# Patient Record
Sex: Male | Born: 1945 | Race: Black or African American | Hispanic: No | Smoking: Current every day smoker
Health system: Southern US, Community
[De-identification: ages and names within clinical notes are randomized; demographics above are authoritative.]

## PROBLEM LIST (undated history)

## (undated) DIAGNOSIS — E119 Type 2 diabetes mellitus without complications: Secondary | ICD-10-CM

---

## 2008-12-07 ENCOUNTER — Inpatient Hospital Stay (HOSPITAL_COMMUNITY): Admission: AD | Admit: 2008-12-07 | Discharge: 2008-12-09 | Payer: Self-pay | Admitting: Internal Medicine

## 2008-12-07 ENCOUNTER — Encounter: Payer: Self-pay | Admitting: Emergency Medicine

## 2008-12-07 ENCOUNTER — Ambulatory Visit: Payer: Self-pay | Admitting: Diagnostic Radiology

## 2008-12-08 ENCOUNTER — Encounter (INDEPENDENT_AMBULATORY_CARE_PROVIDER_SITE_OTHER): Payer: Self-pay | Admitting: Internal Medicine

## 2009-01-12 ENCOUNTER — Ambulatory Visit: Payer: Self-pay | Admitting: Internal Medicine

## 2009-03-03 ENCOUNTER — Encounter: Admission: RE | Admit: 2009-03-03 | Discharge: 2009-03-03 | Payer: Self-pay | Admitting: Internal Medicine

## 2010-07-07 IMAGING — CR DG CHEST 2V
2 series · 2 of 2 positions shown · non-contrast
Comparison: None.

CLINICAL DATA: Right-sided weakness and right arm parasthesias.

CHEST - 2 VIEW

[w chest pa]
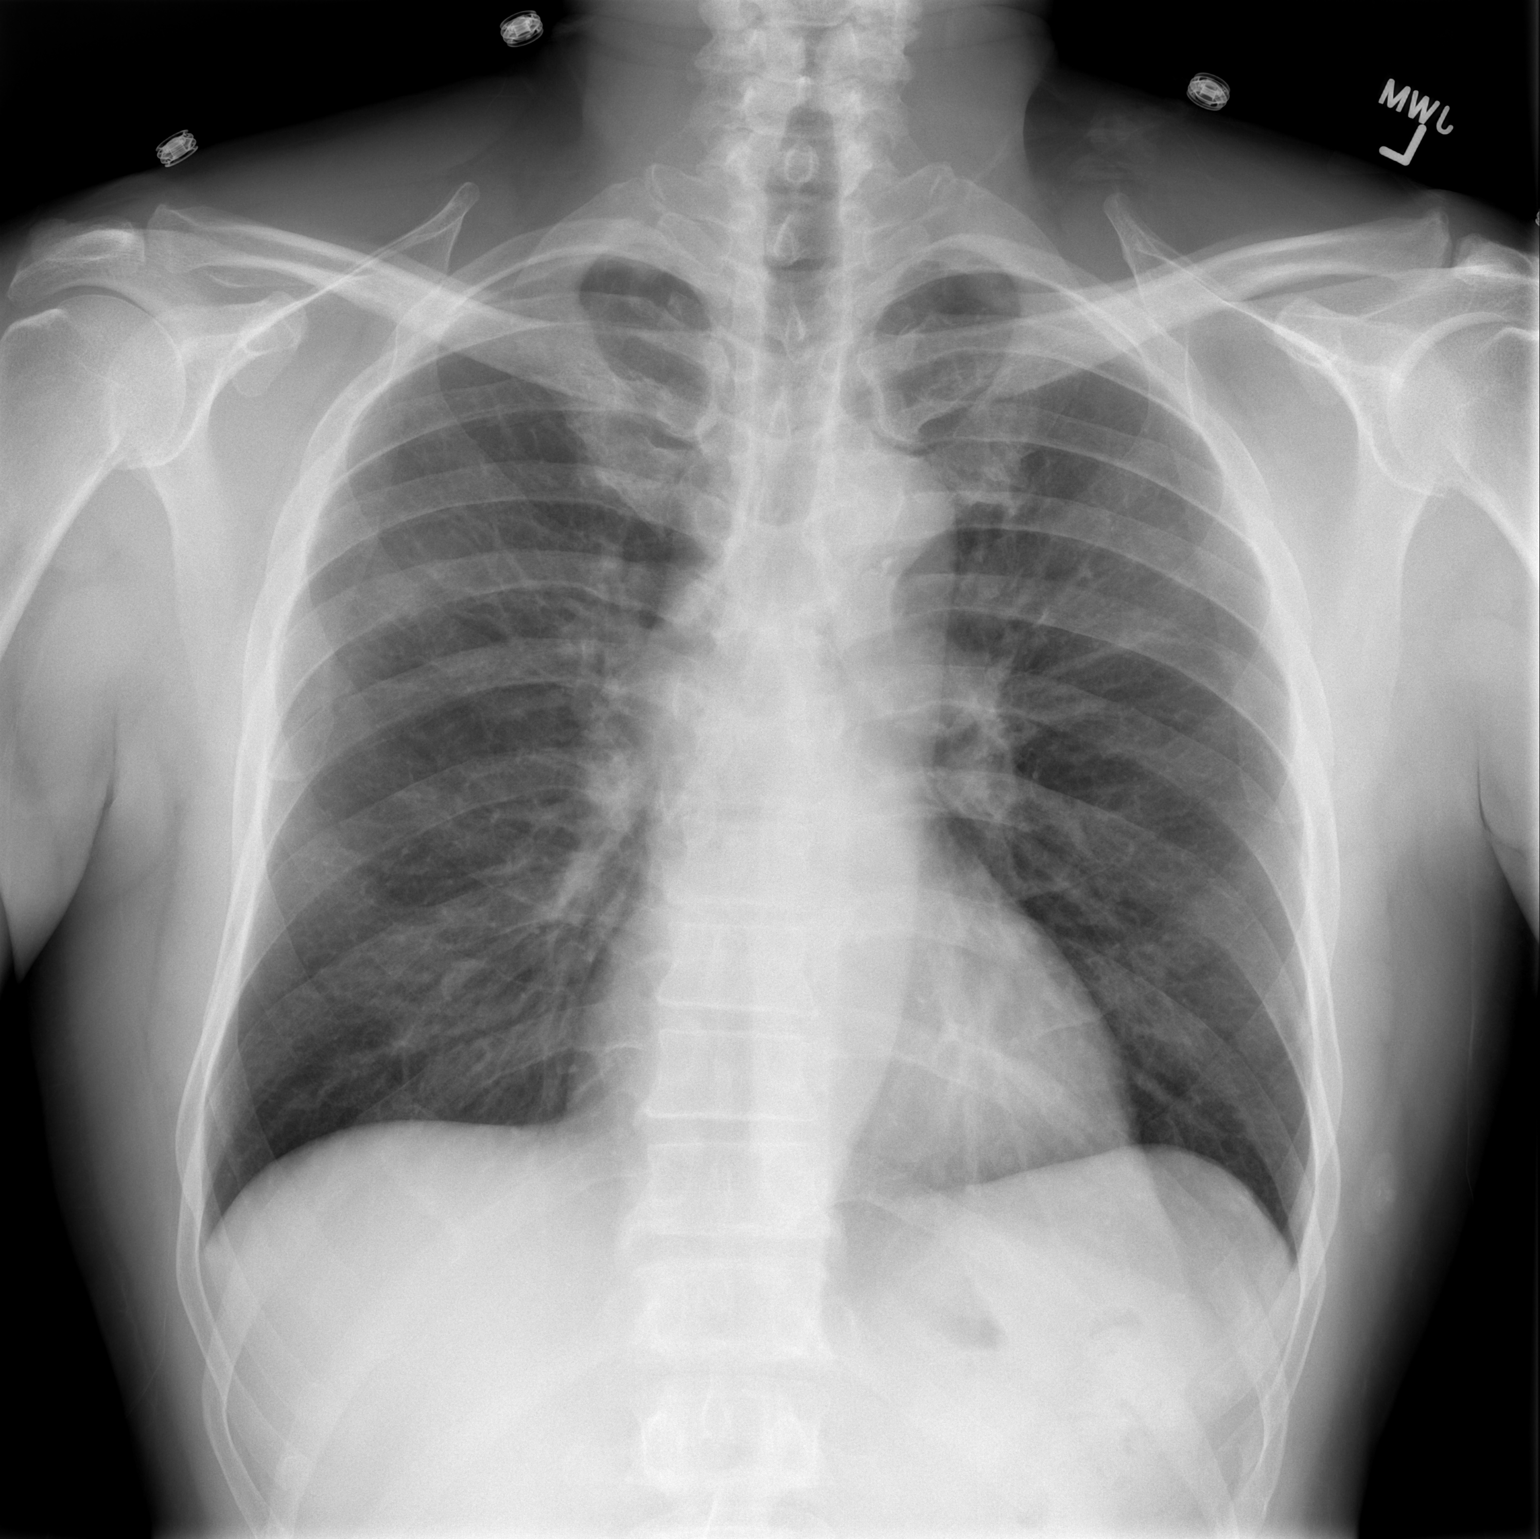

[w chest lat]
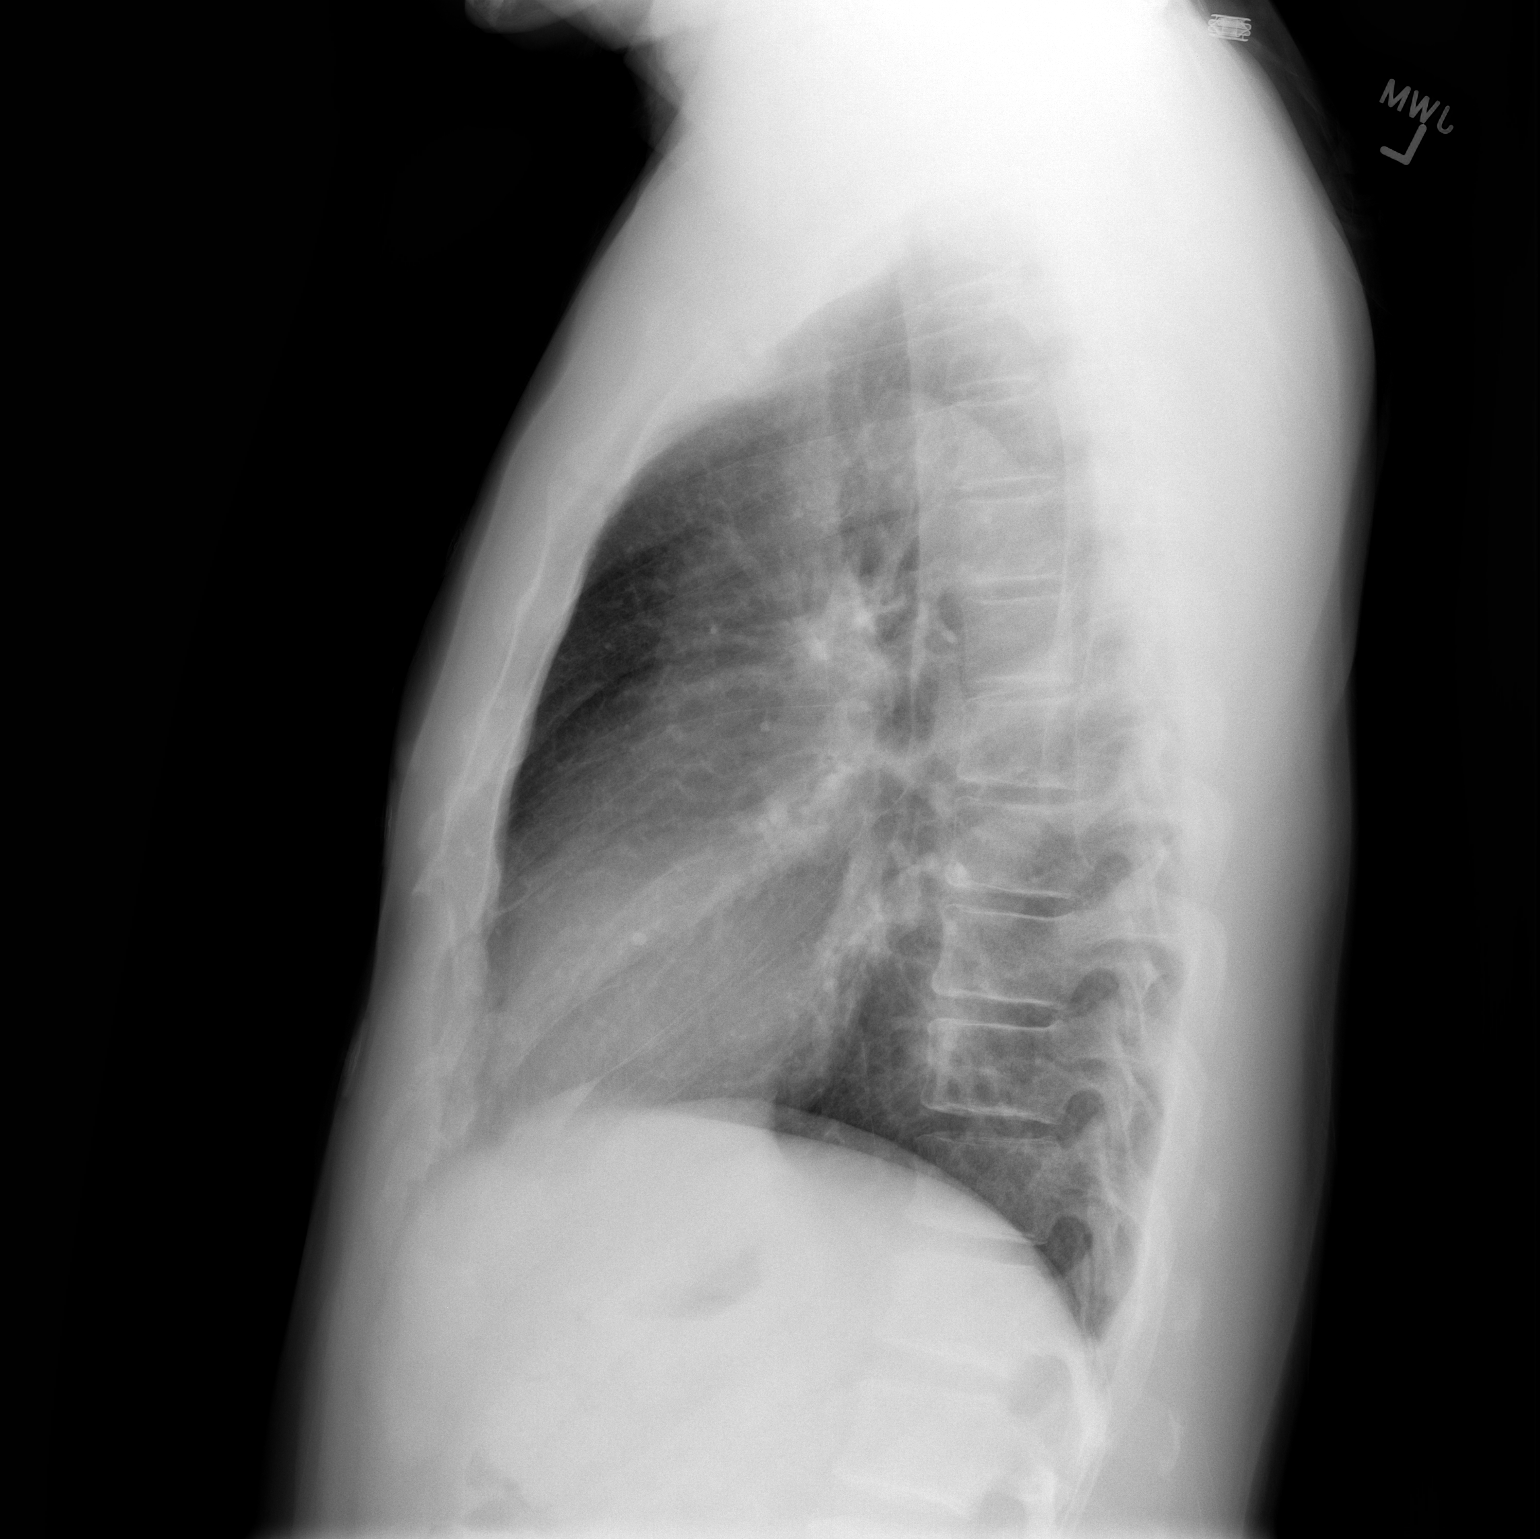

[2 of 2 positions shown; findings below may reference images not displayed]

FINDINGS: Normal sized heart.  Mildly tortuous aorta.  Clear lungs.
Minimal diffuse peribronchial thickening.  Unremarkable bones.
IMPRESSION: Minimal chronic bronchitic changes.  No acute abnormality.

## 2011-05-02 NOTE — Consult Note (Signed)
NAME:  Tim Watts, Tim Watts               ACCOUNT NO.:  [id number]   MEDICAL RECORD NO.:  [id number]          PATIENT TYPE:  INP   LOCATION:  3007                         FACILITY:  [facility]   PHYSICIAN:  Pramod P. Pearlean Brownie, MD    DATE OF BIRTH:  06-20-1946   DATE OF CONSULTATION:  DATE OF DISCHARGE:                                 CONSULTATION   REASON FOR REFERRAL:  Stroke.   HISTORY OF PRESENT ILLNESS:  Tim Watts is a 65 year old African  American gentleman who developed sudden onset of dizziness, right-sided  clumsiness 2 days ago while working in the yard.  He did not seek  medical help, but came in for admission the next day.  He has a long-  standing history of diabetes, but does not take any medications for  that.  On arrival yesterday, he was noticed as having mild right-sided  clumsiness which seems to be improving.  He is walking better with  physical therapy today.   PAST MEDICAL HISTORY:  Significant for diabetes and smoking.   HOME MEDICATIONS:  None.   MEDICATION ALLERGIES:  None.   SOCIAL HISTORY:  The patient smokes 1 pack per day.  Denies drinking  alcohol, though he has been known to use cocaine and marijuana on a  weekly basis.  He is living at home.  He is married.  He is independent  in activities of living.   REVIEW OF SYSTEMS:  Negative for any significant fever, cough, chest  pain, shortness of breath, diarrhea, or other illness.   PHYSICAL EXAMINATION:  GENERAL:  Middle-aged [place] American gentleman  who is at present not in distress.  VITAL SIGNS:  He is afebrile, pulse rate 62 per minute regular,  respiratory rate 20 per minute, temperature 97.6, blood pressure 139-169  systolic over 82-84 diastolic.  HEENT:  Head is nontraumatic.  ENT exam unremarkable.  NECK:  Supple.  There is no bruit.  CARDIAC:  Regular heart sounds.  LUNGS:  Clear to auscultation.  NEUROLOGIC:  He is pleasant, awake, alert, and cooperative with no  aphasia, apraxia, or dysarthria.   Eye movements are full range.  There  is no nystagmus or facial asymmetry.  Tongue is midline.  Motor system  exam reveals no upper extremity drift, but fine finger movements are  diminished on the right.  He orbits the left over the right upper  extremity.  There is minimum weakness of right grip.  Foot tapping is  slightly slow on the right.  He walks with a broad-based gait.   DATA REVIEW:  An MRI scan of the brain shows a left paramedian pontine  infarct.  There are mild changes of small vessel disease.  MRA of the  brain shows no significant large vessel intracranial stenosis.   LABORATORY STUDIES:  Hemoglobin A1c is elevated at 7.4, total  cholesterol is 167, but LDL is elevated at 106, HDL is 39, triglycerides  161.  Urine drug screen is positive only for benzos.  Carotid Doppler  shows no significant extracranial stenosis.  2-D echo results are  pending at this time.  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   PLAN:  I agree with starting aspirin for secondary stroke prevention as  well as smoking cessation counseling and starting Zocor for his elevated  lipid.  Tight control of diabetes with hemoglobin A1c goal below 7.   I had a long discussion with the patient and multiple family members  regarding the need to be compliant with medications and to control his  risk factors to prevent him from having more disabling strokes in the  future.  He can follow up with me electively in the future in the  office.  The patient can be discharged home in the a.m. with outpatient  therapy needs as required.   Thank you for the referral.           ______________________________  Sunny Schlein. Pearlean Brownie, MD     PPS/MEDQ  D:  12/08/2008  T:  12/09/2008  Job:  [id number]

## 2011-05-02 NOTE — Discharge Summary (Signed)
NAME:  Tim Watts, Tim Watts NO.:  [id number]   MEDICAL RECORD NO.:  [id number]          PATIENT TYPE:  INP   LOCATION:  3007                         FACILITY:  [facility]   PHYSICIAN:  Beckey Rutter, MD  DATE OF BIRTH:  [birth year]   DATE OF ADMISSION:  12/07/2008  DATE OF DISCHARGE:  12/09/2008                               DISCHARGE SUMMARY   PRIMARY CARE PHYSICIAN:  Unassigned.   CHIEF COMPLAINT:  Right-sided clumsiness and dizziness.   HISTORY OF PRESENT ILLNESS:  Please refer to the H&P dictated on the day  of admission by Dr. Jetty Duhamel.   HOSPITAL COURSE:  During hospital course, the patient was seen by stroke  team.  The patient had MRI and MRA which revealed left pontine posterior  infarct.  The patient was worked up for stroke and seen by stroke team.  Please refer to the result of the MRI/MRA below.   HOSPITAL PROCEDURES:  1. Chest x-ray on December 07, 2008, impression; minimal chronic      bronchitic change and no acute abnormalities.  2. December 07, 2008, the patient had CT head without contrast.      Impression was normal examination.  3. On December 07, 2008, the patient had MRA an MRI.  The MRI result      is showing acute nonhemorrhagic left pontine infarct.  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  Mild      branch vessel atherosclerotic type change more notable within the      posterior circulation.   LABORATORY DATA:  Homocysteine is 9.1.  Lipid profile showing total  cholesterol 167, TG is 112, HDL is 39, LDL is 106, A1c is 7.4.   DISCHARGE MEDICATIONS:  1. Metformin 500 mg p.o. b.i.d.  2. Zocor 20 mg p.o. at night.  3. Aspirin 325 mg daily.   DISCHARGE PLAN:  The patient will await for the result of 2-D echo for  completion of work.  He will be discharged today in the medication  above.  The patient had [facility]  appointment for follow-up and  he was also advised to follow up with Neurology.  The patient's diabetes  was stable during the hospital stay and he will  be discharged with metformin.  The patient was advised and counseled  regarding tobacco abuse and substance abuse.  Prescription for pain and  glucometer was done.  All the patient's questions was encouraged and  answered.  He is stable for discharge.  He is aware and agreeable to  discharge plan.      Beckey Rutter, MD  Electronically Signed     EME/MEDQ  D:  12/09/2008  T:  12/09/2008  Job:  [id number]

## 2011-05-02 NOTE — H&P (Signed)
NAME:  Tim Watts, Tim Watts NO.:  [id number]   MEDICAL RECORD NO.:  [id number]          PATIENT TYPE:  INP   LOCATION:  3007                         FACILITY:  [facility]   PHYSICIAN:  Lonia Blood, M.D.DATE OF BIRTH:  1946-02-11   DATE OF ADMISSION:  12/07/2008  DATE OF DISCHARGE:                              HISTORY & PHYSICAL   PRIMARY CARE PHYSICIAN:  Unassigned.   CHIEF COMPLAINT:  Right-sided clumsiness and dizziness.   HISTORY OF PRESENT ILLNESS:  Tim Watts is a 65 year old  gentleman who does not receive regular medical care due to financial  issues.  He has a known history of borderline diabetes.  He admits to  marijuana use and occasional cocaine abuse as well as essentially  lifelong tobacco abuse.  He was in his usual state of health, working in  the yard yesterday when at approximately 3:00 p.m. he experienced the  acute onset of dizziness.  This was mild initially.  Over approximately  a 15- to 20-minute period it became much more severe to the point he  felt he would fall down.  He went into the house to rest.  After  approximately 45 minutes the patient stood again and found that he was  falling severely to the right.  He felt that his right arm and leg were  very clumsy.  He went to bed.  When he woke up this morning he noticed  that he was significantly weaker in the right leg and arm.  He also  states that these extremities were very clumsy.  He had difficulty  brushing his teeth or combing his hair, as he is right handed.  He also  felt that he was constantly falling to the right.  [facility] for evaluation and was ultimately transferred  for direct admission at [facility] for evaluation of stroke.   The patient states that he has not had similar problems in the past.  He  has no chest pain.  No fevers, chills, nausea, vomiting, diarrhea,  abdominal pain, shortness of breath, cough, fevers.  He  has had no acute  visual change of late.   REVIEW OF SYSTEMS:  Comprehensive review of systems is unremarkable with  the exception of the positive elements noted in the history of present  illness above.   PAST MEDICAL HISTORY:  1. Borderline diabetes.  2. Tobacco abuse in the amount of one pack per day minimum since      teenager.  3. Occasional cocaine abuse.  4. Occasional marijuana abuse.   OUTPATIENT MEDICATIONS:  None.   ALLERGIES:  No known drug allergies.   FAMILY HISTORY:  The patient's mother died from breast cancer.  The  patient's father died from a heat stroke.   SOCIAL HISTORY:  The patient lives in [place], he is presently  unemployed.  He is married.  He has healthy children.  He occasionally  partakes of alcohol but not to excess.   DATA REVIEW:  CBC is unremarkable.  Alcohol level is negative.  BMET is  unremarkable.  Coags are normal.  Urinalysis is negative.  Urine drug  screen is positive for cocaine. Point of care markers are negative x1.  CT scan of the head reveals no acute disease.  Chest x-ray reveals no  acute disease.   A 12-lead EKG reveals normal sinus rhythm at 66 beats per minute.  There  is significant J-point elevation in leads V2 and V3 and some peaking T-  waves in V4 and V5.  There is no clear ST-segment elevation.  There do  appear to be occasional missed beats.   PHYSICAL EXAMINATION:  VITAL SIGNS:  Temperature 97.8, blood pressure  169/83, heart rate 64, respiratory rate 20, O2 sat 95% on room air.  CBG  is 161.  GENERAL:  Well-developed, well-nourished gentleman in no acute  respiratory distress.  HEENT:  Normocephalic, atraumatic.  Pupils are equal, round, reactive to  light and accommodation.  Extraocular muscles are intact bilaterally.  OC/OO clear.  NECK:  No JVD.  LUNGS: Clear to auscultation bilaterally without wheezes or rhonchi.  CARDIOVASCULAR: Regular rate and rhythm without murmur, gallop or rub.  Normal S1, S2.   ABDOMEN: Nontender, nondistended, soft bowel sounds present.  No  organomegaly, no rebound or ascites.  EXTREMITIES: No significant  cyanosis, clubbing, edema to bilateral upper and lower extremities.  NEUROLOGIC: The patient displays a 5/5 strength of bilateral upper and  lower extremities. His finger-to-nose is intact at this point, though  very slow. There is no nystagmus.  Cranial nerves II-XII are intact  bilaterally.  There is no Babinski.  DTRs are 1+ and symmetric.   IMPRESSION AND PLAN:  1. 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  He in fact could touch      the examiner's finger, but every single time missed his own nose      and frequently touched his ear or even his jaw.  This has      significantly improved at the present time.  The patient certainly      has significant risk factors for a CVA and not to mention cocaine      abuse on top of this.  It does appear, however, that he is rapidly      improving though it has now been significantly greater than 24      hours since the onset of his symptoms.  We will initiate full      evaluation as per the stroke protocol.  MRI and MRA will be      accomplished as well as the other multitude of tests indicated on      the a stroke protocol.  Aspirin will be initiated, as the patient      was previously on no medication whatsoever.  I have already      counseled the patient extensively as to his need to stop smoking      and to discontinue cocaine abuse immediately.  Physical therapy,      occupational therapy, and speech and language pathology will be      consulted, and ongoing care will be carried out as indicated.  2. Diabetes mellitus - it would appear despite the patient's labeling      of borderline diabetes the  patient  has true, full-blown diabetes.      We will place the patient on sliding scale insulin and a restricted      diet if he is cleared for p.o. intake and follow his CBGs closely.      It is quite likely that he will need it at minimum oral agents at      the time of his discharge.  3. Tobacco abuse - I have counseled the patient extensively as to the      direct connection between ongoing tobacco abuse and cerebrovascular      disease.  I have advised him to discontinue smoking immediately.      We will request tobacco cessation consultation during the patient's      hospital stay to further drive this point home.  4. Substance abuse - I have explained to the patient the use of      cocaine puts even 20 year olds at exceedingly high risk for      cerebrovascular events.  I have explained to him that it      significantly increased his risk of not only recurrent strokes but      also of heart attacks.  I have advised him to discontinue cocaine      abuse immediately and completely.  5. Elevated blood pressure - it is not clear at this time if the      patient truly has hypertension or if he simply has an      elevated blood pressure in that his systolic is approximately 160-      170 at present, I do not wish to acutely decrease it.  We will      follow his blood pressure trend closely and consider treatment if      his systolic becomes further elevated.      Lonia Blood, M.D.  Electronically Signed     JTM/MEDQ  D:  12/07/2008  T:  12/07/2008  Job:  [id number]

## 2011-09-22 LAB — LIPID PANEL
LDL Cholesterol: 106 mg/dL — ABNORMAL HIGH (ref 0–99)
Total CHOL/HDL Ratio: 4.3 RATIO
Triglycerides: 112 mg/dL (ref <150)
VLDL: 22 mg/dL (ref 0–40)

## 2011-09-22 LAB — GLUCOSE, CAPILLARY
Glucose-Capillary: 120 mg/dL — ABNORMAL HIGH (ref 70–99)
Glucose-Capillary: 132 mg/dL — ABNORMAL HIGH (ref 70–99)
Glucose-Capillary: 147 mg/dL — ABNORMAL HIGH (ref 70–99)
Glucose-Capillary: 161 mg/dL — ABNORMAL HIGH (ref 70–99)

## 2011-09-22 LAB — POCT CARDIAC MARKERS
CKMB, poc: 1.5 ng/mL (ref 1.0–8.0)
Myoglobin, poc: 60.7 ng/mL (ref 12–200)
Troponin i, poc: <0.05 ng/mL (ref 0.00–0.09)

## 2011-09-22 LAB — URINALYSIS, ROUTINE W REFLEX MICROSCOPIC
Bilirubin Urine: NEGATIVE
Hgb urine dipstick: NEGATIVE
Ketones, ur: NEGATIVE mg/dL
Nitrite: NEGATIVE
Protein, ur: NEGATIVE mg/dL
Specific Gravity, Urine: 1.012 (ref 1.005–1.030)
Urobilinogen, UA: 0.2 mg/dL (ref 0.0–1.0)

## 2011-09-22 LAB — CBC
MCHC: 31.7 g/dL (ref 30.0–36.0)
Platelets: 214 10*3/uL (ref 150–400)
RBC: 4.96 MIL/uL (ref 4.22–5.81)
WBC: 6.4 10*3/uL (ref 4.0–10.5)

## 2011-09-22 LAB — COMPREHENSIVE METABOLIC PANEL
AST: 20 U/L (ref 0–37)
CO2: 26 mEq/L (ref 19–32)
Calcium: 9.3 mg/dL (ref 8.4–10.5)
Creatinine, Ser: 0.99 mg/dL (ref 0.4–1.5)
GFR calc Af Amer: >60 mL/min (ref >60)
GFR calc non Af Amer: >60 mL/min (ref >60)
Glucose, Bld: 139 mg/dL — ABNORMAL HIGH (ref 70–99)
Sodium: 137 mEq/L (ref 135–145)
Total Protein: 7 g/dL (ref 6.0–8.3)

## 2011-09-22 LAB — CARDIAC PANEL(CRET KIN+CKTOT+MB+TROPI): Troponin I: <0.01 ng/mL (ref 0.00–0.06)

## 2011-09-22 LAB — DIFFERENTIAL
Basophils Relative: 1 % (ref 0–1)
Eosinophils Absolute: 0.2 10*3/uL (ref 0.0–0.7)
Monocytes Relative: 9 % (ref 3–12)
Neutro Abs: 3.1 10*3/uL (ref 1.7–7.7)
Neutrophils Relative %: 50 % (ref 43–77)

## 2011-09-22 LAB — BLOOD GAS, ARTERIAL
Acid-Base Excess: 1 mmol/L (ref 0.0–2.0)
FIO2: 0.21 %
pCO2 arterial: 41.1 mmHg (ref 35.0–45.0)
pO2, Arterial: 81 mmHg (ref 80.0–100.0)

## 2011-09-22 LAB — POCT TOXICOLOGY PANEL: Cocaine: POSITIVE

## 2011-09-22 LAB — URINE CULTURE

## 2011-09-22 LAB — BASIC METABOLIC PANEL
BUN: 15 mg/dL (ref 6–23)
CO2: 25 mEq/L (ref 19–32)
Calcium: 9.1 mg/dL (ref 8.4–10.5)
Creatinine, Ser: 0.9 mg/dL (ref 0.4–1.5)
GFR calc Af Amer: >60 mL/min (ref >60)

## 2011-09-22 LAB — PROTIME-INR
INR: 1 (ref 0.00–1.49)
Prothrombin Time: 13.4 seconds (ref 11.6–15.2)

## 2011-09-22 LAB — ETHANOL: Alcohol, Ethyl (B): <10 mg/dL (ref 0–10)

## 2011-09-22 LAB — HEMOGLOBIN A1C: Hgb A1c MFr Bld: 7.4 % — ABNORMAL HIGH (ref 4.6–6.1)

## 2011-09-22 LAB — APTT: aPTT: 26 seconds (ref 24–37)

## 2023-03-18 ENCOUNTER — Encounter (HOSPITAL_BASED_OUTPATIENT_CLINIC_OR_DEPARTMENT_OTHER): Payer: Self-pay

## 2023-03-18 ENCOUNTER — Emergency Department (HOSPITAL_BASED_OUTPATIENT_CLINIC_OR_DEPARTMENT_OTHER)
Admission: EM | Admit: 2023-03-18 | Discharge: 2023-03-18 | Disposition: A | Discharge status: Home / Self Care | Payer: Medicare (Managed Care) | Attending: Emergency Medicine | Admitting: Emergency Medicine

## 2023-03-18 ENCOUNTER — Emergency Department (HOSPITAL_BASED_OUTPATIENT_CLINIC_OR_DEPARTMENT_OTHER): Payer: Medicare (Managed Care)

## 2023-03-18 DIAGNOSIS — E119 Type 2 diabetes mellitus without complications: Secondary | ICD-10-CM | POA: Insufficient documentation

## 2023-03-18 DIAGNOSIS — M5441 Lumbago with sciatica, right side: Secondary | ICD-10-CM

## 2023-03-18 DIAGNOSIS — I10 Essential (primary) hypertension: Secondary | ICD-10-CM | POA: Insufficient documentation

## 2023-03-18 DIAGNOSIS — M545 Low back pain, unspecified: Secondary | ICD-10-CM | POA: Diagnosis present

## 2023-03-18 HISTORY — DX: Type 2 diabetes mellitus without complications: E11.9

## 2023-03-18 MED ORDER — LIDOCAINE 5 % EX PTCH: 1.0000 | MEDICATED_PATCH | CUTANEOUS | 0 refills | Status: AC

## 2023-03-18 MED ORDER — KETOROLAC TROMETHAMINE 15 MG/ML IJ SOLN
15.0000 mg | Freq: Once | INTRAMUSCULAR | Status: AC
  Administered 2023-03-18: 15 mg via INTRAMUSCULAR
  Filled 2023-03-18: qty 1

## 2023-03-18 MED ORDER — ACETAMINOPHEN 325 MG PO TABS: 650.0000 mg | ORAL_TABLET | Freq: Four times a day (QID) | ORAL | 0 refills | Status: AC | PRN

## 2023-03-18 MED ORDER — LIDOCAINE 5 % EX PTCH
1.0000 | MEDICATED_PATCH | CUTANEOUS | Status: DC
  Administered 2023-03-18: 1 via TRANSDERMAL
  Filled 2023-03-18: qty 1

## 2023-03-18 MED ORDER — METHOCARBAMOL 500 MG PO TABS: 500.0000 mg | ORAL_TABLET | Freq: Three times a day (TID) | ORAL | 0 refills | Status: AC | PRN

## 2023-03-18 NOTE — ED Notes (Signed)
D/c paperwork reviewed with pt, including prescriptions.  No questions or concerns voiced at time of d/c. Marland Kitchen Pt verbalized understanding, Wheeled by ED staff to ED exit, NAD.

## 2023-03-18 NOTE — ED Triage Notes (Signed)
Pt reports back pain that radiates down right leg. Woke up Saturday morning with pain .

## 2023-03-18 NOTE — ED Provider Notes (Signed)
[facility] Provider Note   CSN: [id number] Arrival date & time: 03/18/23  1559     History {Add pertinent medical, surgical, social history, OB history to HPI:1} Chief Complaint  Patient presents with   Back Pain    Tim Watts is a 77 y.o. male.  History of type 2 diabetes and hypertension.  He is on metformin.  Presents the ER for right low back pain that started yesterday radiates down the posterior aspect of the right leg.  Pain is worse with twisting his torso and bending over.  Denies numbness or tingling, no saddle anesthesia or paresthesia, no bowel or bladder incontinence, no fevers chills nausea or other symptoms.  He has never had this in the past.  Denies any injury or trauma or overexertion prior to symptom onset.   Back Pain      Home Medications Prior to Admission medications   Not on File      Allergies    Patient has no known allergies.    Review of Systems   Review of Systems  Musculoskeletal:  Positive for back pain.    Physical Exam Updated Vital Signs BP (!) 166/82 (BP Location: Right Arm)   Pulse (!) 110   Temp 98.6 F (37 C) (Oral)   Resp 17   Ht 6' (1.829 m)   Wt 68.5 kg   SpO2 98%   BMI 20.48 kg/m  Physical Exam Vitals and nursing note reviewed.  Constitutional:      General: He is not in acute distress.    Appearance: He is well-developed.  HENT:     Head: Normocephalic and atraumatic.  Eyes:     Conjunctiva/sclera: Conjunctivae normal.  Cardiovascular:     Rate and Rhythm: Normal rate and regular rhythm.     Heart sounds: No murmur heard.    Comments: HR 88 on monitor Pulmonary:     Effort: Pulmonary effort is normal. No respiratory distress.     Breath sounds: Normal breath sounds.  Abdominal:     Palpations: Abdomen is soft.     Tenderness: There is no abdominal tenderness.  Musculoskeletal:        General: No swelling. Normal range of motion.     Cervical back: Neck  supple.     Thoracic back: Normal.     Lumbar back: Tenderness present. No swelling or edema. Negative right straight leg raise test and negative left straight leg raise test.  Skin:    General: Skin is warm and dry.     Capillary Refill: Capillary refill takes less than 2 seconds.  Neurological:     General: No focal deficit present.     Mental Status: He is alert and oriented to person, place, and time.  Psychiatric:        Mood and Affect: Mood normal.     ED Results / Procedures / Treatments   Labs (all labs ordered are listed, but only abnormal results are displayed) Labs Reviewed - No data to display  EKG None  Radiology No results found.  Procedures Procedures  {Document cardiac monitor, telemetry assessment procedure when appropriate:1}  Medications Ordered in ED Medications  ketorolac (TORADOL) 15 MG/ML injection 15 mg (has no administration in time range)  lidocaine (LIDODERM) 5 % 1 patch (has no administration in time range)    ED Course/ Medical Decision Making/ A&P   {   Click here for ABCD2, HEART and other calculatorsREFRESH Note before signing :  1}                          Medical Decision Making This patient presents to the ED for concern of back pain this involves an extensive number of treatment options, and is a complaint that carries with it a high risk of complications and morbidity.  The differential diagnosis includes sprain, strain, HNP, fracture, DDD, muscle spasm, cauda equina, epidural abscess or hematoma, malignancy, other   Co morbidities that complicate the patient evaluation  ***   Additional history obtained:  Additional history obtained from *** External records from outside source obtained and reviewed including ***   Lab Tests:  I Ordered, and personally interpreted labs.  The pertinent results include:  ***    Imaging Studies ordered:  I ordered imaging studies including ***  I independently visualized and interpreted  imaging which showed *** I agree with the radiologist interpretation      Problem List / ED Course / Critical interventions / Medication management  Patient presents with low back pain ***. Differential considered as above. Symptoms are likely due to ***. They have no high risk features including saddle anesthesia/paresthesia, bowel or bladder incontinence, urinary retention, fever, weight loss, history of cancer or immune suppression, or IV drug use. We discussed plan to treat symptoms with ***, and follow up with ***. They were advised on strict return precautions.  I ordered medication including ***  for ***  Reevaluation of the patient after these medicines showed that the patient {resolved/improved/worsened:23923::"improved"} I have reviewed the patients home medicines and have made adjustments as needed     Test / Admission - Considered:  Consider labs the patient has no systemic symptoms.  Pain is reproducible with range of motion and palpation.  CT ordered and shows bulge at L1-L2 and some mild spinal stenosis, no acute findings.   Amount and/or Complexity of Data Reviewed Radiology: ordered.  Risk Prescription drug management.   ***  {Document critical care time when appropriate:1} {Document review of labs and clinical decision tools ie heart score, Chads2Vasc2 etc:1}  {Document your independent review of radiology images, and any outside records:1} {Document your discussion with family members, caretakers, and with consultants:1} {Document social determinants of health affecting pt's care:1} {Document your decision making why or why not admission, treatments were needed:1} Final Clinical Impression(s) / ED Diagnoses Final diagnoses:  None    Rx / DC Orders ED Discharge Orders     None

## 2023-03-18 NOTE — ED Notes (Signed)
Pt transported to imaging.

## 2023-03-19 NOTE — ED Provider Notes (Incomplete)
[facility] Provider Note   CSN: [id number] Arrival date & time: 03/18/23  1559     History {Add pertinent medical, surgical, social history, OB history to HPI:1} Chief Complaint  Patient presents with  . Back Pain    Tim Watts is a 77 y.o. male.  History of type 2 diabetes and hypertension.  He is on metformin.  Presents the ER for right low back pain that started yesterday radiates down the posterior aspect of the right leg.  Pain is worse with twisting his torso and bending over.  Denies numbness or tingling, no saddle anesthesia or paresthesia, no bowel or bladder incontinence, no fevers chills nausea or other symptoms.  He has never had this in the past.  Denies any injury or trauma or overexertion prior to symptom onset.   Back Pain      Home Medications Prior to Admission medications   Not on File      Allergies    Patient has no known allergies.    Review of Systems   Review of Systems  Musculoskeletal:  Positive for back pain.    Physical Exam Updated Vital Signs BP (!) 166/82 (BP Location: Right Arm)   Pulse (!) 110   Temp 98.6 F (37 C) (Oral)   Resp 17   Ht 6' (1.829 m)   Wt 68.5 kg   SpO2 98%   BMI 20.48 kg/m  Physical Exam Vitals and nursing note reviewed.  Constitutional:      General: He is not in acute distress.    Appearance: He is well-developed.  HENT:     Head: Normocephalic and atraumatic.  Eyes:     Conjunctiva/sclera: Conjunctivae normal.  Cardiovascular:     Rate and Rhythm: Normal rate and regular rhythm.     Heart sounds: No murmur heard.    Comments: HR 88 on monitor Pulmonary:     Effort: Pulmonary effort is normal. No respiratory distress.     Breath sounds: Normal breath sounds.  Abdominal:     Palpations: Abdomen is soft.     Tenderness: There is no abdominal tenderness.  Musculoskeletal:        General: No swelling. Normal range of motion.     Cervical back: Neck  supple.     Thoracic back: Normal.     Lumbar back: Tenderness present. No swelling or edema. Negative right straight leg raise test and negative left straight leg raise test.  Skin:    General: Skin is warm and dry.     Capillary Refill: Capillary refill takes less than 2 seconds.  Neurological:     General: No focal deficit present.     Mental Status: He is alert and oriented to person, place, and time.  Psychiatric:        Mood and Affect: Mood normal.     ED Results / Procedures / Treatments   Labs (all labs ordered are listed, but only abnormal results are displayed) Labs Reviewed - No data to display  EKG None  Radiology No results found.  Procedures Procedures  {Document cardiac monitor, telemetry assessment procedure when appropriate:1}  Medications Ordered in ED Medications  ketorolac (TORADOL) 15 MG/ML injection 15 mg (has no administration in time range)  lidocaine (LIDODERM) 5 % 1 patch (has no administration in time range)    ED Course/ Medical Decision Making/ A&P   {   Click here for ABCD2, HEART and other calculatorsREFRESH Note before signing :  1}                          Medical Decision Making This patient presents to the ED for concern of back pain this involves an extensive number of treatment options, and is a complaint that carries with it a high risk of complications and morbidity.  The differential diagnosis includes sprain, strain, HNP, fracture, DDD, muscle spasm, cauda equina, epidural abscess or hematoma, malignancy, other   Co morbidities that complicate the patient evaluation  Type 2 diabetes, hypertension   Additional history obtained:  Additional history obtained from EMR External records from outside source obtained and reviewed including  outpatient PCP notes   Lab Tests:   Imaging Studies ordered: CT lumbar spine ordered due to patient's advanced age and history of no previous back pain.  He has gotten mild disc bulge L1-L2  and to the level degenerative changes.     Problem List / ED Course / Critical interventions / Medication management  Patient presents with low back pain that is atraumatic. Differential considered as above. 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 They have no high risk features including saddle anesthesia/paresthesia, bowel or bladder incontinence, urinary retention, fever, weight loss, history of cancer or immune suppression, or IV drug use. We discussed plan to treat symptoms with muscle relaxers,, and follow up with ***. They were advised on strict return precautions.  I ordered medication including ***  for ***  Reevaluation of the patient after these medicines showed that the patient {resolved/improved/worsened:23923::"improved"} I have reviewed the patients home medicines and have made adjustments as needed     Test / Admission - Considered:  Consider labs the patient has no systemic symptoms.  Pain is reproducible with range of motion and palpation.  CT ordered and shows bulge at L1-L2 and some mild spinal stenosis, no acute findings.   Amount and/or Complexity of Data Reviewed Radiology: ordered.  Risk OTC drugs. Prescription drug management.   ***  {Document critical care time when appropriate:1} {Document review of labs and clinical decision tools ie heart score, Chads2Vasc2 etc:1}  {Document your independent review of radiology images, and any outside records:1} {Document your discussion with family members, caretakers, and with consultants:1} {Document social determinants of health affecting pt's care:1} {Document your decision making why or why not admission, treatments were needed:1} Final Clinical Impression(s) / ED Diagnoses Final diagnoses:  None    Rx / DC Orders ED Discharge Orders     None

## 2024-01-03 ENCOUNTER — Emergency Department (HOSPITAL_BASED_OUTPATIENT_CLINIC_OR_DEPARTMENT_OTHER): Payer: Self-pay

## 2024-01-03 ENCOUNTER — Emergency Department (HOSPITAL_BASED_OUTPATIENT_CLINIC_OR_DEPARTMENT_OTHER)
Admission: EM | Admit: 2024-01-03 | Discharge: 2024-01-03 | Disposition: A | Discharge status: Home / Self Care | Payer: Self-pay | Attending: Emergency Medicine | Admitting: Emergency Medicine

## 2024-01-03 ENCOUNTER — Encounter (HOSPITAL_BASED_OUTPATIENT_CLINIC_OR_DEPARTMENT_OTHER): Payer: Self-pay

## 2024-01-03 ENCOUNTER — Other Ambulatory Visit: Payer: Self-pay

## 2024-01-03 DIAGNOSIS — J189 Pneumonia, unspecified organism: Secondary | ICD-10-CM

## 2024-01-03 DIAGNOSIS — J181 Lobar pneumonia, unspecified organism: Secondary | ICD-10-CM | POA: Insufficient documentation

## 2024-01-03 DIAGNOSIS — Z20822 Contact with and (suspected) exposure to covid-19: Secondary | ICD-10-CM | POA: Insufficient documentation

## 2024-01-03 LAB — CBC WITH DIFFERENTIAL/PLATELET
Abs Immature Granulocytes: 0.17 10*3/uL — ABNORMAL HIGH (ref 0.00–0.07)
Basophils Absolute: 0.1 10*3/uL (ref 0.0–0.1)
Basophils Relative: 0 %
Eosinophils Absolute: 0 10*3/uL (ref 0.0–0.5)
Eosinophils Relative: 0 %
HCT: 41.7 % (ref 39.0–52.0)
Hemoglobin: 13.5 g/dL (ref 13.0–17.0)
Immature Granulocytes: 1 %
Lymphocytes Relative: 8 %
Lymphs Abs: 1.7 10*3/uL (ref 0.7–4.0)
MCH: 27.3 pg (ref 26.0–34.0)
MCHC: 32.4 g/dL (ref 30.0–36.0)
MCV: 84.4 fL (ref 80.0–100.0)
Monocytes Absolute: 2 10*3/uL — ABNORMAL HIGH (ref 0.1–1.0)
Monocytes Relative: 9 %
Neutro Abs: 17.7 10*3/uL — ABNORMAL HIGH (ref 1.7–7.7)
Neutrophils Relative %: 82 %
Platelets: 248 10*3/uL (ref 150–400)
RBC: 4.94 MIL/uL (ref 4.22–5.81)
RDW: 13.1 % (ref 11.5–15.5)
WBC: 21.7 10*3/uL — ABNORMAL HIGH (ref 4.0–10.5)
nRBC: 0 % (ref 0.0–0.2)

## 2024-01-03 LAB — URINALYSIS, MICROSCOPIC (REFLEX)

## 2024-01-03 LAB — URINALYSIS, ROUTINE W REFLEX MICROSCOPIC
Bilirubin Urine: NEGATIVE
Glucose, UA: >=500 mg/dL — AB
Ketones, ur: NEGATIVE mg/dL
Leukocytes,Ua: NEGATIVE
Nitrite: NEGATIVE
Protein, ur: 100 mg/dL — AB
Specific Gravity, Urine: 1.025 (ref 1.005–1.030)
pH: 5.5 (ref 5.0–8.0)

## 2024-01-03 LAB — BASIC METABOLIC PANEL
Anion gap: 12 (ref 5–15)
BUN: 20 mg/dL (ref 8–23)
CO2: 24 mmol/L (ref 22–32)
Calcium: 9.2 mg/dL (ref 8.9–10.3)
Chloride: 99 mmol/L (ref 98–111)
Creatinine, Ser: 1.28 mg/dL — ABNORMAL HIGH (ref 0.61–1.24)
GFR, Estimated: 57 mL/min — ABNORMAL LOW (ref >60)
Glucose, Bld: 245 mg/dL — ABNORMAL HIGH (ref 70–99)
Potassium: 4 mmol/L (ref 3.5–5.1)
Sodium: 135 mmol/L (ref 135–145)

## 2024-01-03 LAB — LACTIC ACID, PLASMA: Lactic Acid, Venous: 1.4 mmol/L (ref 0.5–1.9)

## 2024-01-03 LAB — RESP PANEL BY RT-PCR (RSV, FLU A&B, COVID)  RVPGX2
Influenza A by PCR: NEGATIVE
Influenza B by PCR: NEGATIVE
Resp Syncytial Virus by PCR: NEGATIVE
SARS Coronavirus 2 by RT PCR: NEGATIVE

## 2024-01-03 MED ORDER — SODIUM CHLORIDE 0.9 % IV SOLN
1.0000 g | Freq: Once | INTRAVENOUS | Status: AC
  Administered 2024-01-03: 1 g via INTRAVENOUS
  Filled 2024-01-03: qty 10

## 2024-01-03 MED ORDER — ACETAMINOPHEN 325 MG PO TABS
650.0000 mg | ORAL_TABLET | Freq: Once | ORAL | Status: AC
  Administered 2024-01-03: 650 mg via ORAL
  Filled 2024-01-03: qty 2

## 2024-01-03 MED ORDER — CEFPODOXIME PROXETIL 200 MG PO TABS: 200.0000 mg | ORAL_TABLET | Freq: Two times a day (BID) | ORAL | 0 refills | Status: AC

## 2024-01-03 MED ORDER — AZITHROMYCIN 250 MG PO TABS: 250.0000 mg | ORAL_TABLET | Freq: Every day | ORAL | 0 refills | Status: AC

## 2024-01-03 MED ORDER — AZITHROMYCIN 250 MG PO TABS
500.0000 mg | ORAL_TABLET | Freq: Once | ORAL | Status: AC
Start: 2024-01-03 — End: 2024-01-03
  Administered 2024-01-03: 500 mg via ORAL
  Filled 2024-01-03: qty 2

## 2024-01-03 NOTE — ED Provider Notes (Signed)
[facility] Provider Note   CSN: [id number] Arrival date & time: 01/03/24  1900     History  Chief Complaint  Patient presents with   Fever   Cough    Tim Watts is a 78 y.o. male.  He is brought in by ambulance for cough and fever.  He said he has been sick for about a week with minimally productive cough fever and chills.  He has also had some sneezing.  Thinks he might have the flu.  Has tried nothing for it.  He had a temperature of 103.3 for EMS and was given Tylenol.  Here he was afebrile.  The history is provided by the patient.  Fever Max temp prior to arrival:  103.3 Chronicity:  New Relieved by:  Acetaminophen Associated symptoms: chills and cough   Associated symptoms: no chest pain, no diarrhea, no nausea, no sore throat and no vomiting   Cough Cough characteristics:  Non-productive Sputum characteristics:  Nondescript Progression:  Unchanged Chronicity:  New Smoker: yes   Associated symptoms: chills and fever   Associated symptoms: no chest pain and no sore throat        Home Medications Prior to Admission medications   Medication Sig Start Date End Date Taking? Authorizing Provider  acetaminophen (TYLENOL) 325 MG tablet Take 2 tablets (650 mg total) by mouth every 6 (six) hours as needed. 03/18/23   Carmel Sacramento A, PA-C  lidocaine (LIDODERM) 5 % Place 1 patch onto the skin daily. Remove & Discard patch within 12 hours or as directed by MD 03/18/23   Carmel Sacramento A, PA-C  methocarbamol (ROBAXIN) 500 MG tablet Take 1 tablet (500 mg total) by mouth every 8 (eight) hours as needed for muscle spasms. 03/18/23   Carmel Sacramento A, PA-C      Allergies    Patient has no known allergies.    Review of Systems   Review of Systems  Constitutional:  Positive for chills and fever.  HENT:  Negative for sore throat.   Respiratory:  Positive for cough.   Cardiovascular:  Negative for chest pain.  Gastrointestinal:   Negative for diarrhea, nausea and vomiting.    Physical Exam Updated Vital Signs BP 117/68 (BP Location: Right Arm)   Pulse (!) 109   Temp 99.6 F (37.6 C)   Resp 16   Ht 6\' 2"  (1.88 m)   Wt 68 kg   SpO2 93%   BMI 19.26 kg/m  Physical Exam Vitals and nursing note reviewed.  Constitutional:      General: He is not in acute distress.    Appearance: Normal appearance. He is well-developed.  HENT:     Head: Normocephalic and atraumatic.  Eyes:     Conjunctiva/sclera: Conjunctivae normal.  Cardiovascular:     Rate and Rhythm: Normal rate and regular rhythm.     Heart sounds: No murmur heard. Pulmonary:     Effort: Pulmonary effort is normal. No respiratory distress.     Breath sounds: Normal breath sounds.  Abdominal:     Palpations: Abdomen is soft.     Tenderness: There is no abdominal tenderness. There is no guarding or rebound.  Musculoskeletal:        General: No deformity.     Cervical back: Neck supple.  Skin:    General: Skin is warm and dry.     Capillary Refill: Capillary refill takes less than 2 seconds.  Neurological:     General: No  focal deficit present.     Mental Status: He is alert.     ED Results / Procedures / Treatments   Labs (all labs ordered are listed, but only abnormal results are displayed) Labs Reviewed  BASIC METABOLIC PANEL - Abnormal; Notable for the following components:      Result Value   Glucose, Bld 245 (*)    Creatinine, Ser 1.28 (*)    GFR, Estimated 57 (*)    All other components within normal limits  CBC WITH DIFFERENTIAL/PLATELET - Abnormal; Notable for the following components:   WBC 21.7 (*)    Neutro Abs 17.7 (*)    Monocytes Absolute 2.0 (*)    Abs Immature Granulocytes 0.17 (*)    All other components within normal limits  URINALYSIS, ROUTINE W REFLEX MICROSCOPIC - Abnormal; Notable for the following components:   Glucose, UA >=500 (*)    Hgb urine dipstick TRACE (*)    Protein, ur 100 (*)    All other components  within normal limits  URINALYSIS, MICROSCOPIC (REFLEX) - Abnormal; Notable for the following components:   Bacteria, UA FEW (*)    All other components within normal limits  RESP PANEL BY RT-PCR (RSV, FLU A&B, COVID)  [id number]  CULTURE, BLOOD (ROUTINE X 2)  CULTURE, BLOOD (ROUTINE X 2)  LACTIC ACID, PLASMA    EKG None  Radiology DG Chest 2 View Result Date: 01/03/2024 CLINICAL DATA:  Cough, fever EXAM: CHEST - 2 VIEW COMPARISON:  12/07/2008 FINDINGS: Patchy left lower lobe opacity, suspicious for pneumonia. No pleural effusion or pneumothorax. The heart is normal in size. Visualized osseous structures are within normal limits. IMPRESSION: Left lower lobe pneumonia. Electronically Signed   By: Charline Bills M.D.   On: 01/03/2024 22:50    Procedures Procedures    Medications Ordered in ED Medications  acetaminophen (TYLENOL) tablet 650 mg (650 mg Oral Given 01/03/24 2236)  cefTRIAXone (ROCEPHIN) 1 g in sodium chloride 0.9 % 100 mL IVPB (0 g Intravenous Stopped 01/03/24 2334)  azithromycin (ZITHROMAX) tablet 500 mg (500 mg Oral Given 01/03/24 2302)    ED Course/ Medical Decision Making/ A&P Clinical Course as of 01/04/24 0955  Thu Jan 03, 2024  2211 Chest x-ray interpreted by me as no acute infiltrate.  Awaiting radiology reading. [MB]    Clinical Course User Index [MB] Terrilee Files, MD                                 Medical Decision Making Amount and/or Complexity of Data Reviewed Labs: ordered. Radiology: ordered.  Risk OTC drugs. Prescription drug management.   This patient complains of cough fever chills fatigue; this involves an extensive number of treatment Options and is a complaint that carries with it a high risk of complications and morbidity. The differential includes pneumonia, COVID, flu, bronchitis, dehydration  I ordered, reviewed and interpreted labs, which included CBC elevated white count, chemistries with elevated glucose and creatinine,  lactate normal, blood culture sent, urinalysis without signs of infection, COVID and flu negative I ordered medication IV antibiotics and reviewed PMP when indicated. I ordered imaging studies which included chest x-ray and I independently    visualized and interpreted imaging which showed left lower lobe infiltrate Additional history obtained from patient's family member Previous records obtained and reviewed in epic no recent admissions  Cardiac monitoring reviewed, sinus rhythm Social determinants considered, tobacco use Critical Interventions: None  After  the interventions stated above, I reevaluated the patient and found patient to be resting comfortably and satting well on room air Admission and further testing considered, he was offered admission to the hospital for further management and he declines this.  Will put him on antibiotics and recommended close follow-up with PCP.  Return instructions discussed         Final Clinical Impression(s) / ED Diagnoses Final diagnoses:  Community acquired pneumonia of left lower lobe of lung    Rx / DC Orders ED Discharge Orders          Ordered    azithromycin (ZITHROMAX) 250 MG tablet  Daily        01/03/24 2305    cefpodoxime (VANTIN) 200 MG tablet  2 times daily        01/03/24 2305              Terrilee Files, MD 01/04/24 [phone]

## 2024-01-03 NOTE — ED Triage Notes (Addendum)
Pt BIB by [facility] for cough and fever. Temp 103.3 per EMS Tylenol given 1830

## 2024-01-03 NOTE — Discharge Instructions (Addendum)
You were seen in the emergency department for chills and cough.  Your chest x-ray showed a left lower lobe pneumonia.  We are starting you on 2 antibiotics.  Please rest and drink plenty of fluids.  Follow-up with your regular doctor.  Return to the emergency department if any worsening or concerning symptoms

## 2024-01-09 LAB — CULTURE, BLOOD (ROUTINE X 2)
Culture: NO GROWTH
Culture: NO GROWTH
Special Requests: ADEQUATE
